# Patient Record
Sex: Male | Born: 1975 | Race: White | Hispanic: No | Marital: Married | Smoking: Never smoker
Health system: Southern US, Community
[De-identification: ages and names within clinical notes are randomized; demographics above are authoritative.]

## PROBLEM LIST (undated history)

## (undated) DIAGNOSIS — N4340 Spermatocele of epididymis, unspecified: Secondary | ICD-10-CM

## (undated) DIAGNOSIS — L409 Psoriasis, unspecified: Secondary | ICD-10-CM

## (undated) DIAGNOSIS — M7670 Peroneal tendinitis, unspecified leg: Secondary | ICD-10-CM

## (undated) DIAGNOSIS — M25372 Other instability, left ankle: Secondary | ICD-10-CM

## (undated) DIAGNOSIS — M1990 Unspecified osteoarthritis, unspecified site: Secondary | ICD-10-CM

## (undated) DIAGNOSIS — L309 Dermatitis, unspecified: Secondary | ICD-10-CM

## (undated) HISTORY — PX: SKIN GRAFT: SHX250

## (undated) HISTORY — PX: SHOULDER ARTHROSCOPY: SHX128

## (undated) HISTORY — PX: MYRINGOTOMY: SUR874

---

## 2006-11-24 ENCOUNTER — Ambulatory Visit: Payer: Self-pay | Admitting: Internal Medicine

## 2006-11-24 LAB — CONVERTED CEMR LAB
ALT: 29 units/L (ref 0–53)
AST: 42 units/L — ABNORMAL HIGH (ref 0–37)
Albumin: 4.5 g/dL (ref 3.5–5.2)
Alkaline Phosphatase: 38 units/L — ABNORMAL LOW (ref 39–117)
BUN: 11 mg/dL (ref 6–23)
Basophils Absolute: 0 10*3/uL (ref 0.0–0.1)
Basophils Relative: 0.5 % (ref 0.0–1.0)
Bilirubin, Direct: 0.2 mg/dL (ref 0.0–0.3)
CO2: 32 meq/L (ref 19–32)
Calcium: 9.4 mg/dL (ref 8.4–10.5)
Chloride: 106 meq/L (ref 96–112)
Cholesterol: 195 mg/dL (ref 0–200)
Creatinine, Ser: 1.1 mg/dL (ref 0.4–1.5)
Eosinophils Absolute: 0.1 10*3/uL (ref 0.0–0.6)
Eosinophils Relative: 1.7 % (ref 0.0–5.0)
GFR calc Af Amer: 103 mL/min
GFR calc non Af Amer: 85 mL/min
Glucose, Bld: 92 mg/dL (ref 70–99)
HCT: 40.7 % (ref 39.0–52.0)
HDL: 54.6 mg/dL (ref >39.0)
Hemoglobin: 14 g/dL (ref 13.0–17.0)
LDL Cholesterol: 130 mg/dL — ABNORMAL HIGH (ref 0–99)
Lymphocytes Relative: 34.7 % (ref 12.0–46.0)
MCHC: 34.4 g/dL (ref 30.0–36.0)
MCV: 91.4 fL (ref 78.0–100.0)
Monocytes Absolute: 0.3 10*3/uL (ref 0.2–0.7)
Monocytes Relative: 6.2 % (ref 3.0–11.0)
Neutro Abs: 2.5 10*3/uL (ref 1.4–7.7)
Neutrophils Relative %: 56.9 % (ref 43.0–77.0)
Platelets: 225 10*3/uL (ref 150–400)
Potassium: 3.9 meq/L (ref 3.5–5.1)
RBC: 4.45 M/uL (ref 4.22–5.81)
RDW: 12 % (ref 11.5–14.6)
Sodium: 142 meq/L (ref 135–145)
TSH: 1.57 microintl units/mL (ref 0.35–5.50)
Total Bilirubin: 1.3 mg/dL — ABNORMAL HIGH (ref 0.3–1.2)
Total CHOL/HDL Ratio: 3.6
Total Protein: 6.9 g/dL (ref 6.0–8.3)
Triglycerides: 52 mg/dL (ref 0–149)
VLDL: 10 mg/dL (ref 0–40)
WBC: 4.4 10*3/uL — ABNORMAL LOW (ref 4.5–10.5)

## 2006-11-27 ENCOUNTER — Telehealth: Payer: Self-pay | Admitting: Internal Medicine

## 2006-12-08 ENCOUNTER — Ambulatory Visit: Payer: Self-pay | Admitting: Internal Medicine

## 2007-03-20 ENCOUNTER — Ambulatory Visit: Payer: Self-pay | Admitting: Internal Medicine

## 2007-03-27 ENCOUNTER — Telehealth: Payer: Self-pay | Admitting: Internal Medicine

## 2007-11-27 ENCOUNTER — Ambulatory Visit: Payer: Self-pay | Admitting: Internal Medicine

## 2007-11-30 ENCOUNTER — Telehealth: Payer: Self-pay | Admitting: Internal Medicine

## 2007-12-03 ENCOUNTER — Ambulatory Visit: Payer: Self-pay | Admitting: Internal Medicine

## 2007-12-03 LAB — CONVERTED CEMR LAB
Bilirubin Urine: NEGATIVE
Blood in Urine, dipstick: NEGATIVE
Specific Gravity, Urine: 1.02
Urobilinogen, UA: 0.2

## 2007-12-06 ENCOUNTER — Encounter: Payer: Self-pay | Admitting: Internal Medicine

## 2008-03-21 ENCOUNTER — Ambulatory Visit: Payer: Self-pay | Admitting: Internal Medicine

## 2008-03-21 DIAGNOSIS — R109 Unspecified abdominal pain: Secondary | ICD-10-CM | POA: Insufficient documentation

## 2009-06-03 ENCOUNTER — Telehealth: Payer: Self-pay | Admitting: Internal Medicine

## 2010-02-22 ENCOUNTER — Ambulatory Visit
Admission: RE | Admit: 2010-02-22 | Discharge: 2010-02-22 | Payer: Self-pay | Source: Home / Self Care | Attending: Internal Medicine | Admitting: Internal Medicine

## 2010-02-22 DIAGNOSIS — R079 Chest pain, unspecified: Secondary | ICD-10-CM | POA: Insufficient documentation

## 2010-03-09 NOTE — Progress Notes (Signed)
Summary: Pt req antibiotic. Pls call in to [facility] on [address]  Phone Note Call from Patient Call back at [phone] cell   Caller: Patient Summary of Call: Pt called and says he has a swollen lft testicle and he would like to get an antibiotic called in for it. Please call in to [facility] on [address] and [address]  Initial call taken by: Lucy Antigua,  June 03, 2009 9:43 AM  Follow-up for Phone Call        cipro 500 two times a day for 10 days if persists or worsens he needs OV Follow-up by: Birdie Sons MD,  June 03, 2009 12:15 PM  Additional Follow-up for Phone Call Additional follow up Details #1::        Patient notified.   See Rx.  will call back as instructed. Additional Follow-up by: Gladis Riffle, RN,  June 03, 2009 1:57 PM    New/Updated Medications: CIPROFLOXACIN HCL 500 MG TABS (CIPROFLOXACIN HCL) Take 1 tablet by mouth two times a day x 10 days Prescriptions: CIPROFLOXACIN HCL 500 MG TABS (CIPROFLOXACIN HCL) Take 1 tablet by mouth two times a day x 10 days  #20 x 0   Entered by:   Gladis Riffle, RN   Authorized by:   Birdie Sons MD   Signed by:   Gladis Riffle, RN on 06/03/2009   Method used:   Electronically to        [facility] # [phone]* (retail)       [address]       [place], [place]  [address]       Ph: [phone]       Fax: [phone]   RxID:   [id number]
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
## 2010-03-14 ENCOUNTER — Inpatient Hospital Stay (INDEPENDENT_AMBULATORY_CARE_PROVIDER_SITE_OTHER)
Admission: RE | Admit: 2010-03-14 | Discharge: 2010-03-14 | Disposition: A | Discharge status: Home / Self Care | Payer: Managed Care, Other (non HMO) | Source: Ambulatory Visit | Attending: Family Medicine | Admitting: Family Medicine

## 2010-03-14 DIAGNOSIS — J310 Chronic rhinitis: Secondary | ICD-10-CM

## 2012-04-07 DIAGNOSIS — M7670 Peroneal tendinitis, unspecified leg: Secondary | ICD-10-CM

## 2012-04-07 DIAGNOSIS — M25372 Other instability, left ankle: Secondary | ICD-10-CM

## 2012-04-07 HISTORY — DX: Peroneal tendinitis, unspecified leg: M76.70

## 2012-04-07 HISTORY — DX: Other instability, left ankle: M25.372

## 2012-04-19 ENCOUNTER — Encounter (HOSPITAL_BASED_OUTPATIENT_CLINIC_OR_DEPARTMENT_OTHER): Payer: Self-pay | Admitting: *Deleted

## 2012-04-26 ENCOUNTER — Ambulatory Visit (HOSPITAL_BASED_OUTPATIENT_CLINIC_OR_DEPARTMENT_OTHER): Payer: Managed Care, Other (non HMO) | Admitting: Certified Registered Nurse Anesthetist

## 2012-04-26 ENCOUNTER — Encounter (HOSPITAL_BASED_OUTPATIENT_CLINIC_OR_DEPARTMENT_OTHER): Payer: Self-pay | Admitting: Certified Registered Nurse Anesthetist

## 2012-04-26 ENCOUNTER — Ambulatory Visit (HOSPITAL_BASED_OUTPATIENT_CLINIC_OR_DEPARTMENT_OTHER)
Admission: RE | Admit: 2012-04-26 | Discharge: 2012-04-26 | Disposition: A | Discharge status: Home / Self Care | Payer: Managed Care, Other (non HMO) | Source: Ambulatory Visit | Attending: Orthopedic Surgery | Admitting: Orthopedic Surgery

## 2012-04-26 ENCOUNTER — Encounter (HOSPITAL_BASED_OUTPATIENT_CLINIC_OR_DEPARTMENT_OTHER): Payer: Self-pay

## 2012-04-26 ENCOUNTER — Encounter (HOSPITAL_BASED_OUTPATIENT_CLINIC_OR_DEPARTMENT_OTHER)
Admission: RE | Disposition: A | Discharge status: Home / Self Care | Payer: Self-pay | Source: Ambulatory Visit | Attending: Orthopedic Surgery

## 2012-04-26 DIAGNOSIS — M24873 Other specific joint derangements of unspecified ankle, not elsewhere classified: Secondary | ICD-10-CM | POA: Insufficient documentation

## 2012-04-26 DIAGNOSIS — M25372 Other instability, left ankle: Secondary | ICD-10-CM

## 2012-04-26 DIAGNOSIS — M24173 Other articular cartilage disorders, unspecified ankle: Secondary | ICD-10-CM | POA: Insufficient documentation

## 2012-04-26 DIAGNOSIS — M24073 Loose body in unspecified ankle: Secondary | ICD-10-CM | POA: Insufficient documentation

## 2012-04-26 DIAGNOSIS — M659 Unspecified synovitis and tenosynovitis, unspecified site: Secondary | ICD-10-CM | POA: Insufficient documentation

## 2012-04-26 DIAGNOSIS — M2408 Loose body, other site: Secondary | ICD-10-CM | POA: Insufficient documentation

## 2012-04-26 DIAGNOSIS — M65979 Unspecified synovitis and tenosynovitis, unspecified ankle and foot: Secondary | ICD-10-CM | POA: Insufficient documentation

## 2012-04-26 DIAGNOSIS — M959 Acquired deformity of musculoskeletal system, unspecified: Secondary | ICD-10-CM | POA: Insufficient documentation

## 2012-04-26 DIAGNOSIS — M24176 Other articular cartilage disorders, unspecified foot: Secondary | ICD-10-CM | POA: Insufficient documentation

## 2012-04-26 HISTORY — DX: Unspecified osteoarthritis, unspecified site: M19.90

## 2012-04-26 HISTORY — PX: ANKLE ARTHROSCOPY WITH RECONSTRUCTION: SHX5583

## 2012-04-26 HISTORY — DX: Peroneal tendinitis, unspecified leg: M76.70

## 2012-04-26 HISTORY — DX: Other instability, left ankle: M25.372

## 2012-04-26 SURGERY — ARTHROSCOPY, ANKLE, WITH RECONSTRUCTION
Anesthesia: General | Site: Ankle | Laterality: Left | Wound class: Clean

## 2012-04-26 MED ORDER — OXYCODONE HCL 5 MG PO TABS: 5.0000 mg | ORAL_TABLET | ORAL | Status: DC | PRN

## 2012-04-26 MED ORDER — DEXAMETHASONE SODIUM PHOSPHATE 10 MG/ML IJ SOLN
INTRAMUSCULAR | Status: DC | PRN
  Administered 2012-04-26: 10 mg

## 2012-04-26 MED ORDER — OXYCODONE HCL 5 MG PO TABS
5.0000 mg | ORAL_TABLET | Freq: Once | ORAL | Status: AC | PRN
  Administered 2012-04-26: 5 mg via ORAL

## 2012-04-26 MED ORDER — SODIUM CHLORIDE 0.9 % IR SOLN
Status: DC | PRN
  Administered 2012-04-26: 3000 mL

## 2012-04-26 MED ORDER — FENTANYL CITRATE 0.05 MG/ML IJ SOLN
50.0000 ug | INTRAMUSCULAR | Status: DC | PRN
  Administered 2012-04-26: 100 ug via INTRAVENOUS

## 2012-04-26 MED ORDER — DEXAMETHASONE SODIUM PHOSPHATE 4 MG/ML IJ SOLN
INTRAMUSCULAR | Status: DC | PRN
  Administered 2012-04-26: 10 mg via INTRAVENOUS

## 2012-04-26 MED ORDER — PROPOFOL 10 MG/ML IV BOLUS
INTRAVENOUS | Status: DC | PRN
  Administered 2012-04-26: 240 mg via INTRAVENOUS

## 2012-04-26 MED ORDER — HYDROMORPHONE HCL PF 1 MG/ML IJ SOLN
0.2500 mg | INTRAMUSCULAR | Status: DC | PRN
  Administered 2012-04-26 (×2): 0.5 mg via INTRAVENOUS

## 2012-04-26 MED ORDER — OXYCODONE HCL 5 MG/5ML PO SOLN: 5.0000 mg | Freq: Once | ORAL | Status: AC | PRN

## 2012-04-26 MED ORDER — ONDANSETRON HCL 4 MG/2ML IJ SOLN
INTRAMUSCULAR | Status: DC | PRN
  Administered 2012-04-26: 4 mg via INTRAVENOUS

## 2012-04-26 MED ORDER — MIDAZOLAM HCL 5 MG/5ML IJ SOLN
INTRAMUSCULAR | Status: DC | PRN
  Administered 2012-04-26: 1 mg via INTRAVENOUS

## 2012-04-26 MED ORDER — ONDANSETRON HCL 4 MG/2ML IJ SOLN: 4.0000 mg | Freq: Once | INTRAMUSCULAR | Status: DC | PRN

## 2012-04-26 MED ORDER — LIDOCAINE HCL (CARDIAC) 20 MG/ML IV SOLN
INTRAVENOUS | Status: DC | PRN
  Administered 2012-04-26: 30 mg via INTRAVENOUS

## 2012-04-26 MED ORDER — EPHEDRINE SULFATE 50 MG/ML IJ SOLN
INTRAMUSCULAR | Status: DC | PRN
  Administered 2012-04-26: 10 mg via INTRAVENOUS

## 2012-04-26 MED ORDER — BUPIVACAINE-EPINEPHRINE PF 0.5-1:200000 % IJ SOLN
INTRAMUSCULAR | Status: DC | PRN
  Administered 2012-04-26: 35 mL

## 2012-04-26 MED ORDER — MIDAZOLAM HCL 2 MG/ML PO SYRP: 12.0000 mg | ORAL_SOLUTION | Freq: Once | ORAL | Status: DC | PRN

## 2012-04-26 MED ORDER — LACTATED RINGERS IV SOLN
INTRAVENOUS | Status: DC
  Administered 2012-04-26 (×2): via INTRAVENOUS

## 2012-04-26 MED ORDER — MIDAZOLAM HCL 2 MG/2ML IJ SOLN
1.0000 mg | INTRAMUSCULAR | Status: DC | PRN
  Administered 2012-04-26: 2 mg via INTRAVENOUS

## 2012-04-26 MED ORDER — BACITRACIN ZINC 500 UNIT/GM EX OINT
TOPICAL_OINTMENT | CUTANEOUS | Status: DC | PRN
  Administered 2012-04-26: 1 via TOPICAL

## 2012-04-26 SURGICAL SUPPLY — 73 items
ANCHOR CORKSCREW 3.5X10 FT (Anchor) ×4 IMPLANT
BANDAGE ESMARK 6X9 LF (GAUZE/BANDAGES/DRESSINGS) ×1 IMPLANT
BLADE CUDA 2.0 (BLADE) IMPLANT
BLADE CUDA GRT WHITE 3.5 (BLADE) ×2 IMPLANT
BLADE CUDA SHAVER 3.5 (BLADE) IMPLANT
BLADE SURG 15 STRL LF DISP TIS (BLADE) ×1 IMPLANT
BLADE SURG 15 STRL SS (BLADE) ×1
BNDG COHESIVE 4X5 TAN STRL (GAUZE/BANDAGES/DRESSINGS) ×2 IMPLANT
BNDG COHESIVE 6X5 TAN STRL LF (GAUZE/BANDAGES/DRESSINGS) ×2 IMPLANT
BNDG ESMARK 6X9 LF (GAUZE/BANDAGES/DRESSINGS) ×2
BUR 3.5 LG SPHERICAL (BURR) IMPLANT
BUR CUDA 2.9 (BURR) ×2 IMPLANT
BUR FULL RADIUS 2.9 (BURR) IMPLANT
BUR GATOR 2.9 (BURR) IMPLANT
BUR OVAL 4.0 (BURR) IMPLANT
BUR SPHERICAL 2.9 (BURR) IMPLANT
BUR VERTEX HOODED 4.5 (BURR) IMPLANT
BURR 3.5 LG SPHERICAL (BURR)
CANISTER OMNI JUG 16 LITER (MISCELLANEOUS) ×2 IMPLANT
CANISTER SUCTION 2500CC (MISCELLANEOUS) ×2 IMPLANT
CHLORAPREP W/TINT 26ML (MISCELLANEOUS) ×2 IMPLANT
CLOTH BEACON ORANGE TIMEOUT ST (SAFETY) ×2 IMPLANT
CUFF TOURNIQUET SINGLE 34IN LL (TOURNIQUET CUFF) ×2 IMPLANT
DRAPE EXTREMITY T 121X128X90 (DRAPE) ×2 IMPLANT
DRAPE OEC MINIVIEW 54X84 (DRAPES) IMPLANT
DRAPE U-SHAPE 47X51 STRL (DRAPES) ×2 IMPLANT
DRSG EMULSION OIL 3X3 NADH (GAUZE/BANDAGES/DRESSINGS) ×2 IMPLANT
DURA STEPPER LG (CAST SUPPLIES) IMPLANT
DURA STEPPER MED (CAST SUPPLIES) IMPLANT
DURA STEPPER SML (CAST SUPPLIES) IMPLANT
ELECT REM PT RETURN 9FT ADLT (ELECTROSURGICAL) ×2
ELECTRODE REM PT RTRN 9FT ADLT (ELECTROSURGICAL) ×1 IMPLANT
GLOVE BIO SURGEON STRL SZ8 (GLOVE) ×4 IMPLANT
GLOVE BIOGEL PI IND STRL 8 (GLOVE) ×1 IMPLANT
GLOVE BIOGEL PI INDICATOR 8 (GLOVE) ×1
GLOVE ECLIPSE 6.5 STRL STRAW (GLOVE) ×2 IMPLANT
GLOVE EXAM NITRILE MD LF STRL (GLOVE) IMPLANT
GLOVE INDICATOR 6.5 STRL GRN (GLOVE) ×2 IMPLANT
GOWN PREVENTION PLUS XLARGE (GOWN DISPOSABLE) ×2 IMPLANT
GOWN PREVENTION PLUS XXLARGE (GOWN DISPOSABLE) ×2 IMPLANT
NS IRRIG 1000ML POUR BTL (IV SOLUTION) IMPLANT
PACK ARTHROSCOPY DSU (CUSTOM PROCEDURE TRAY) ×2 IMPLANT
PACK BASIN DAY SURGERY FS (CUSTOM PROCEDURE TRAY) ×2 IMPLANT
PAD CAST 4YDX4 CTTN HI CHSV (CAST SUPPLIES) ×1 IMPLANT
PADDING CAST ABS 4INX4YD NS (CAST SUPPLIES) ×1
PADDING CAST ABS COTTON 4X4 ST (CAST SUPPLIES) ×1 IMPLANT
PADDING CAST COTTON 4X4 STRL (CAST SUPPLIES) ×1
PADDING CAST COTTON 6X4 STRL (CAST SUPPLIES) ×2 IMPLANT
PENCIL BUTTON HOLSTER BLD 10FT (ELECTRODE) ×2 IMPLANT
SANITIZER HAND PURELL 535ML FO (MISCELLANEOUS) ×2 IMPLANT
SET IRRIG Y TYPE TUR BLADDER L (SET/KITS/TRAYS/PACK) ×2 IMPLANT
SLEEVE SCD COMPRESS KNEE MED (MISCELLANEOUS) ×2 IMPLANT
SPLINT FAST PLASTER 5X30 (CAST SUPPLIES) ×20
SPLINT PLASTER CAST FAST 5X30 (CAST SUPPLIES) ×20 IMPLANT
SPONGE GAUZE 4X4 12PLY (GAUZE/BANDAGES/DRESSINGS) ×2 IMPLANT
SPONGE LAP 18X18 X RAY DECT (DISPOSABLE) ×2 IMPLANT
STOCKINETTE 6  STRL (DRAPES) ×1
STOCKINETTE 6 STRL (DRAPES) ×1 IMPLANT
STRAP ANKLE FOOT DISTRACTOR (ORTHOPEDIC SUPPLIES) ×2 IMPLANT
SUCTION FRAZIER TIP 10 FR DISP (SUCTIONS) IMPLANT
SUT ETHILON 3 0 PS 1 (SUTURE) ×2 IMPLANT
SUT MNCRL AB 3-0 PS2 18 (SUTURE) ×2 IMPLANT
SUT VIC AB 0 CT1 27 (SUTURE) ×1
SUT VIC AB 0 CT1 27XBRD ANBCTR (SUTURE) ×1 IMPLANT
SUT VIC AB 2-0 SH 18 (SUTURE) IMPLANT
SUT VIC AB 2-0 SH 27 (SUTURE)
SUT VIC AB 2-0 SH 27XBRD (SUTURE) IMPLANT
SUT VIC AB 3-0 PS1 18 (SUTURE)
SUT VIC AB 3-0 PS1 18XBRD (SUTURE) IMPLANT
SYR BULB 3OZ (MISCELLANEOUS) ×2 IMPLANT
TOWEL OR 17X24 6PK STRL BLUE (TOWEL DISPOSABLE) ×6 IMPLANT
TUBE CONNECTING 20X1/4 (TUBING) IMPLANT
WATER STERILE IRR 1000ML POUR (IV SOLUTION) ×2 IMPLANT

## 2012-04-26 NOTE — Anesthesia Procedure Notes (Addendum)
Anesthesia Regional Block:  Popliteal block  Pre-Anesthetic Checklist: ,, timeout performed, Correct Patient, Correct Site, Correct Laterality, Correct Procedure, Correct Position, site marked, Risks and benefits discussed,  Surgical consent,  Pre-op evaluation,  At surgeon's request and post-op pain management  Laterality: Left and Lower  Prep: chloraprep       Needles:  Injection technique: Single-shot  Needle Type: Echogenic Needle     Needle Length: 9cm  Needle Gauge: 21    Additional Needles:  Procedures: ultrasound guided (picture in chart) Popliteal block Narrative:  Start time: 04/26/2012 7:10 AM End time: 04/26/2012 7:18 AM Injection made incrementally with aspirations every 5 mL.  Performed by: Personally  Anesthesiologist: Sheldon Silvan, MD  Popliteal block Procedure Name: LMA Insertion Date/Time: 04/26/2012 7:39 AM Performed by: Jetaun Colbath D Pre-anesthesia Checklist: Patient identified, Emergency Drugs available, Suction available and Patient being monitored Patient Re-evaluated:Patient Re-evaluated prior to inductionOxygen Delivery Method: Circle System Utilized Preoxygenation: Pre-oxygenation with 100% oxygen Intubation Type: IV induction Ventilation: Mask ventilation without difficulty LMA: LMA inserted LMA Size: 5.0 Number of attempts: 1 Placement Confirmation: positive ETCO2 Tube secured with: Tape Dental Injury: Teeth and Oropharynx as per pre-operative assessment
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
## 2012-04-26 NOTE — Transfer of Care (Signed)
Immediate Anesthesia Transfer of Care Note  Patient: Matthew Burch  Procedure(s) Performed: Procedure(s): LEFT ANKLE ARTHROSCOPY WITH EXTENSIVE DEBRIDEMENT, REMOVAL OF LOOSE BODY AND MICROFRACTURE; LEFT ANKLE OPEN LATERAL LIGAMENT RECONSTRUCTION; LEFT PERONEUS-LONGUS  TENOLYSIS;  AND  PERONEUS-BREVIS REPAIR  (Left)  Patient Location: PACU  Anesthesia Type:GA combined with regional for post-op pain  Level of Consciousness: awake and patient cooperative  Airway & Oxygen Therapy: Patient Spontanous Breathing and Patient connected to face mask oxygen  Post-op Assessment: Report given to PACU RN and Post -op Vital signs reviewed and stable  Post vital signs: Reviewed and stable  Complications: No apparent anesthesia complications

## 2012-04-26 NOTE — Anesthesia Preprocedure Evaluation (Signed)

## 2012-04-26 NOTE — Progress Notes (Signed)
Assisted Dr. Crews with left, ultrasound guided, popliteal/saphenous block. Side rails up, monitors on throughout procedure. See vital signs in flow sheet. Tolerated Procedure well. 

## 2012-04-26 NOTE — Brief Op Note (Signed)
04/26/2012  9:44 AM  PATIENT:  Matthew Burch  37 y.o. male  PRE-OPERATIVE DIAGNOSIS:  Left Ankle Instability; Peroneal Tendonitis  POST-OPERATIVE DIAGNOSIS:  1.  Left ankle chronic instability      2.  Left lateral talar dome osteochondral defect      3.  Synovitis of medial, lateral and anterior gutters      4.  Loose body in the medial gutter      5.  Left peroneus brevis tendon tear      6.  Left peroneus longus tenosynovitis  Procedure(s): 1.  Left ankle arthroscopy with extensive debridement 2.  Left ankle arthroscopic removal of loose body 3.  Left ankle arthroscopic microfracture of lateral talar dome osteochondral defect 4.  Left ankle peroneus longus tenolysis 5.  Left ankle peroneus brevis tendon repair 6.  Left ankle lateral ligament reconstruction ([place])  SURGEON:  Toni Arthurs, MD  ASSISTANT: n/a  ANESTHESIA:   General, regional  EBL:  minimal   TOURNIQUET:   Total Tourniquet Time Documented: Thigh (Left) - 94 minutes Total: Thigh (Left) - 94 minutes   COMPLICATIONS:  None apparent  DISPOSITION:  Extubated, awake and stable to recovery.  DICTATION ID:   [id number]

## 2012-04-26 NOTE — Anesthesia Postprocedure Evaluation (Signed)
  Anesthesia Post-op Note  Patient: Matthew Burch  Procedure(s) Performed: Procedure(s): LEFT ANKLE ARTHROSCOPY WITH EXTENSIVE DEBRIDEMENT, REMOVAL OF LOOSE BODY AND MICROFRACTURE; LEFT ANKLE OPEN LATERAL LIGAMENT RECONSTRUCTION; LEFT PERONEUS-LONGUS  TENOLYSIS;  AND  PERONEUS-BREVIS REPAIR  (Left)  Patient Location: PACU  Anesthesia Type:GA combined with regional for post-op pain  Level of Consciousness: awake, alert  and oriented  Airway and Oxygen Therapy: Patient Spontanous Breathing and Patient connected to face mask oxygen  Post-op Pain: none  Post-op Assessment: Post-op Vital signs reviewed  Post-op Vital Signs: Reviewed  Complications: No apparent anesthesia complications

## 2012-04-27 NOTE — Op Note (Signed)
NAMEMRK, BUZBY             ACCOUNT NO.:  [id number]  MEDICAL RECORD NO.:  [id number]  LOCATION:                                 FACILITY:  PHYSICIAN:  Toni Arthurs, MD             DATE OF BIRTH:  DATE OF PROCEDURE:  04/26/2012 DATE OF DISCHARGE:                              OPERATIVE REPORT   PREOPERATIVE DIAGNOSIS:  Chronic left ankle instability, left ankle synovitis, and peroneal tendonitis with possible tear.  POSTOPERATIVE DIAGNOSES: 1. Left ankle chronic instability. 2. Left lateral talar dome osteochondral defect. 3. Synovitis of the medial, lateral, and anterior gutters of the left     ankle. 4. Loose body in the left ankle medial gutter. 5. Left peroneus brevis tendon tear. 6. Left peroneus longus tenosynovitis.  PROCEDURES: 1. Left ankle arthroscopy with extensive debridement including the     medial, lateral, anterior, and posterior gutters. 2. Left ankle arthroscopy with removal of loose body. 3. Left ankle microfracture of the lateral talar dome osteochondral     lesion. 4. Left ankle peroneus longus tenolysis. 5. Left ankle peroneus brevis tendon repair. 6. Left ankle lateral ligament reconstruction Toniann Ket)  SURGEON:  Toni Arthurs, MD  ANESTHESIA:  General, regional.  ESTIMATED BLOOD LOSS:  Minimal.  TOURNIQUET TIME:  94 minutes at 225 mmHg.  COMPLICATIONS:  None apparent.  DISPOSITION:  Extubated, awake, and stable to the recovery room.  INDICATIONS FOR PROCEDURE:  The patient is a 37 year old male who has a history of multiple left ankle injuries, playing sports over the years. He has improvement in his pain and instability symptoms when using a brace.  He has failed treatment with activity modification, oral anti- inflammatories, extensive physical therapy, and bracing.  He desires surgical correction of his unstable lateral ankle ligaments.  He also has signs and symptoms consistent with peroneal tenosynovitis and a possible peroneal  tendon tear as well as ankle synovitis.  He presents now for left ankle arthroscopy and lateral ligament reconstruction, possible repair or tenolysis of his peroneal tendons.  He understands the risks, the benefits, the alternative treatment options, and elects surgical treatment.  He specifically understands the risks of bleeding, infection, nerve damage, blood clots, need for additional surgery, continued pain, amputation, and death.  PROCEDURE IN DETAIL:  After preoperative consent was obtained and the correct operative site was identified, the patient was brought to the operating room and placed supine on the operating table.  General anesthesia was induced.  Preoperative antibiotics were administered. Surgical time-out was taken.  The left foot was then examined.  The patient has a grade 2 anterior drawer in neutral and in plantar flexion. He has a grade 0 anterior drawer in external rotation.  The left lower extremity was then prepped and draped in standard sterile fashion with a thigh tourniquet and a thigh holder in place.  The foot was exsanguinated and the tourniquet was inflated to 225 mmHg.  A traction device was placed on the foot.  Anteromedial arthroscopy portal was established and the arthroscope was inserted into the ankle joint. Immediately evident was significant synovitis in the anterior gutter. An anterolateral arthroscopy portal was established using the  nick and spread technique.  The shaver was inserted and the anterior synovitis was debrided.  The lateral gutter was noted to have significant synovitis as well as a Bassett lesion.  This was debrided along with synovitis laterally as well.  The syndesmosis was noted to be stable, but had some synovitis and this was again debrided.  The lateral gutter was noted to have no loose bodies.  The posterior compartment was examined and again had no loose body, but some synovitis which was debrided with the shaver.  The  medial compartment was examined.  There was some grade 1 chondromalacia over the medial talar dome.  The central talar dome had healthy and intact cartilage as did the tibial plafond. The lateral talar dome had an osteochondral lesion that extended from the posterior 2/3rd to about the anterior 1/4.  Anteriorly, there was bone exposed over an area of about 1-cm long and 5-mm wide.  This was debrided of all loose cartilage with a shaver and a microfracture awl was used to punch a hole through the subchondral bone in the central of this area.  The remaining area of the osteochondral lesion had fiber cartilage that was intact with no exposed bone.  The anterior portion of the talus had healthy and intact cartilage.  The medial gutter was examined and a loose body was noted in the recess distal to the medial malleolus.  This was grasped with a grasper after switching the camera to the lateral portal.  The loose body was removed in its entirety. Synovitis in the medial gutter was debrided with the shaver as well.  At this point, all arthroscopic instruments were removed.  A thigh holder was removed.  The left lower extremity was then positioned flat.  A curvilinear incision was made over the lateral malleolus.  Sharp dissection was carried down through the skin and subcutaneous tissue. The ATFL and CFL origins were released from the anterior aspect of the fibula and the periosteum was elevated proximally for about a cm.  The peroneal tendon sheath was incised distally.  The peroneus brevis was noted to have a significant tear.  The peroneus longus was noted to have significant tenosynovitis.  The retinaculum was then incised as was the tendon sheath proximally to get a better look at the tendons.  All the peroneus longus tenosynovitis was excised sharply with scissors and the knife.  The peroneus brevis had low lying muscle belly which was debrided sharply.  The tear was then trimmed of all  fibrinous material. The tear was repaired with horizontal mattress sutures of 0 Vicryl with the knots buried within the tear.  The leading edge of the tear was then repaired with running 0 Vicryl.  At this point, the anterior portion of the fibula was debrided of cortical bone.  Two Arthrex double-loaded 3.5- mm Titanium anchors were inserted, one at the origin of the CFL and one at the origin of the ATFL.  The sutures were then passed into the CFL and ATFL in horizontal mattress fashion.  The ligaments were advanced up onto the bleeding healthy bone of the distal fibula and tied.  The suture was then passed underneath the periosteum and the periosteum was advanced over the tendons.  These were again tied and the sutures were then passed down into the retinaculum, pulling the retinaculum up on to the fibula and over the periosteum and ligament origins.  These sutures were again tied.  The superior peroneal retinaculum was then repaired with imbricating  sutures of the 0 FiberWire placed in subperiosteal fashion.  The peroneal tendon sheath was repaired with simple sutures of 0 FiberWire.  Subcutaneous tissue was approximated with inverted simple sutures of 3-0 Monocryl and a running 3-0 nylon was used to close the skin incision.  The 2 anterior arthroscopy portals were closed with horizontal mattress sutures of 3-0 nylon.  Sterile dressings were applied followed by a well-padded short-leg splint with the hindfoot position in slight eversion.  The tourniquet was released at 94 minutes after application of the dressings.  Prior to applying the dressings, an anterior drawer test was repeated in neutral and in plantar flexion, and it was noted to be grade 0 in both positions.  The patient was then awakened from anesthesia and transported to the recovery room in stable condition.  FOLLOWUP PLAN:  The patient will be nonweightbearing on the left lower extremity.  He will follow up with me in 2  weeks for suture removal and conversion to a Cam walker boot, so that he can begin range of motion.     Toni Arthurs, MD     JH/MEDQ  D:  04/26/2012  T:  04/27/2012  Job:  [id number]

## 2012-04-30 ENCOUNTER — Encounter (HOSPITAL_BASED_OUTPATIENT_CLINIC_OR_DEPARTMENT_OTHER): Payer: Self-pay | Admitting: Orthopedic Surgery

## 2012-09-02 ENCOUNTER — Emergency Department (HOSPITAL_COMMUNITY)
Admission: EM | Admit: 2012-09-02 | Discharge: 2012-09-02 | Disposition: A | Discharge status: Home / Self Care | Payer: Managed Care, Other (non HMO) | Attending: Emergency Medicine | Admitting: Emergency Medicine

## 2012-09-02 ENCOUNTER — Encounter (HOSPITAL_COMMUNITY): Payer: Self-pay | Admitting: Emergency Medicine

## 2012-09-02 ENCOUNTER — Emergency Department (HOSPITAL_COMMUNITY): Payer: Managed Care, Other (non HMO)

## 2012-09-02 DIAGNOSIS — Y9389 Activity, other specified: Secondary | ICD-10-CM | POA: Insufficient documentation

## 2012-09-02 DIAGNOSIS — Z79899 Other long term (current) drug therapy: Secondary | ICD-10-CM | POA: Insufficient documentation

## 2012-09-02 DIAGNOSIS — S61309A Unspecified open wound of unspecified finger with damage to nail, initial encounter: Secondary | ICD-10-CM

## 2012-09-02 DIAGNOSIS — Y9289 Other specified places as the place of occurrence of the external cause: Secondary | ICD-10-CM | POA: Insufficient documentation

## 2012-09-02 DIAGNOSIS — Z23 Encounter for immunization: Secondary | ICD-10-CM | POA: Insufficient documentation

## 2012-09-02 DIAGNOSIS — W230XXA Caught, crushed, jammed, or pinched between moving objects, initial encounter: Secondary | ICD-10-CM | POA: Insufficient documentation

## 2012-09-02 DIAGNOSIS — S61209A Unspecified open wound of unspecified finger without damage to nail, initial encounter: Secondary | ICD-10-CM | POA: Insufficient documentation

## 2012-09-02 DIAGNOSIS — Z8739 Personal history of other diseases of the musculoskeletal system and connective tissue: Secondary | ICD-10-CM | POA: Insufficient documentation

## 2012-09-02 MED ORDER — CEPHALEXIN 250 MG PO CAPS
500.0000 mg | ORAL_CAPSULE | Freq: Once | ORAL | Status: AC
  Administered 2012-09-02: 500 mg via ORAL
  Filled 2012-09-02: qty 2

## 2012-09-02 MED ORDER — ONDANSETRON 4 MG PO TBDP
4.0000 mg | ORAL_TABLET | Freq: Once | ORAL | Status: AC
  Administered 2012-09-02: 4 mg via ORAL
  Filled 2012-09-02: qty 1

## 2012-09-02 MED ORDER — OXYCODONE-ACETAMINOPHEN 5-325 MG PO TABS
2.0000 | ORAL_TABLET | Freq: Once | ORAL | Status: AC
  Administered 2012-09-02: 2 via ORAL
  Filled 2012-09-02: qty 2

## 2012-09-02 MED ORDER — HYDROCODONE-ACETAMINOPHEN 5-325 MG PO TABS: 2.0000 | ORAL_TABLET | ORAL | Status: AC | PRN

## 2012-09-02 MED ORDER — TETANUS-DIPHTH-ACELL PERTUSSIS 5-2.5-18.5 LF-MCG/0.5 IM SUSP
0.5000 mL | Freq: Once | INTRAMUSCULAR | Status: AC
  Administered 2012-09-02: 0.5 mL via INTRAMUSCULAR
  Filled 2012-09-02 (×2): qty 0.5

## 2012-09-02 MED ORDER — CEPHALEXIN 500 MG PO CAPS: 500.0000 mg | ORAL_CAPSULE | Freq: Four times a day (QID) | ORAL | Status: AC

## 2012-09-02 NOTE — Progress Notes (Signed)
Orthopedic Tech Progress Note Patient Details:  Matthew Burch [birth year] [id number]  Ortho Devices Type of Ortho Device: Finger splint Ortho Device/Splint Interventions: Application   Cammer, Mickie Bail 09/02/2012, 5:07 PM

## 2012-09-02 NOTE — ED Notes (Signed)
Patient transported to X-ray 

## 2012-09-02 NOTE — ED Provider Notes (Signed)
Medical screening examination/treatment/procedure(s) were performed by non-physician practitioner and as supervising physician I was immediately available for consultation/collaboration.   Lyanne Co, MD 09/02/12 [phone]
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
## 2012-09-02 NOTE — ED Provider Notes (Signed)
CSN: [id number]     Arrival date & time 09/02/12  1241 History     First MD Initiated Contact with Patient 09/02/12 1302     Chief Complaint  Patient presents with  . Laceration   (Consider location/radiation/quality/duration/timing/severity/associated sxs/prior Treatment) HPI  Matthew Burch is a 36 y.o.male without any significant PMH presents to the ER with complaints of left ring finger injury. He was working on his mountain bike when his finger got caught in a component of the bike causing the nail to partially avulse. The pain is 7/10, it is no longer bleeding, he denies numbness, tingling or being unable to move the finger. Last tetanus shot is unknown.    Past Medical History  Diagnosis Date  . Arthritis     left shoulder  . 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 Peroneal tendonitis 04/2012    left   Past Surgical History  Procedure Laterality Date  . Shoulder arthroscopy Left   . Skin graft  age 81  . Ankle arthroscopy with reconstruction Left 04/26/2012    Procedure: LEFT ANKLE ARTHROSCOPY WITH EXTENSIVE DEBRIDEMENT, REMOVAL OF LOOSE BODY AND MICROFRACTURE; LEFT ANKLE OPEN LATERAL LIGAMENT RECONSTRUCTION; LEFT PERONEUS-LONGUS  TENOLYSIS;  AND  PERONEUS-BREVIS REPAIR ;  Surgeon: Toni Arthurs, MD;  Location: [facility];  Service: Orthopedics;  Laterality: Left;   No family history on file. History  Substance Use Topics  . Smoking status: Never Smoker   . Smokeless tobacco: Never Used  . Alcohol Use: Yes     Comment: 1 drink/day    Review of Systems  Skin: Positive for wound.  All other systems reviewed and are negative.    Allergies  Iodinated diagnostic agents  Home Medications   Current Outpatient Rx  Name  Route  Sig  Dispense  Refill  . cephALEXin (KEFLEX) 500 MG capsule   Oral   Take 1 capsule (500 mg total) by mouth 4 (four) times daily.   28 capsule   0   . HYDROcodone-acetaminophen (NORCO/VICODIN) 5-325 MG per tablet   Oral   Take 2  tablets by mouth every 4 (four) hours as needed for pain.   20 tablet   0    BP 129/86  Pulse 55  Temp(Src) 98 F (36.7 C) (Oral)  Resp 16  Ht 5\' 10"  (1.778 m)  Wt 170 lb (77.111 kg)  BMI 24.39 kg/m2  SpO2 99% Physical Exam  Nursing note and vitals reviewed. Constitutional: He appears well-developed and well-nourished. No distress.  HENT:  Head: Normocephalic and atraumatic.  Eyes: Pupils are equal, round, and reactive to light.  Neck: Normal range of motion. Neck supple.  Cardiovascular: Normal rate and regular rhythm.   Pulmonary/Chest: Effort normal.  Abdominal: Soft.  Musculoskeletal:       Left hand: He exhibits tenderness, bony tenderness, laceration and swelling. He exhibits normal range of motion and normal capillary refill. Normal sensation noted. Normal strength noted.  pts fingernail has been pulled off of bed. No obvious laceration. Not actively bleeding.  Neurological: He is alert.  Skin: Skin is warm and dry.    ED Course   NERVE BLOCK Date/Time: 09/02/2012 2:35 PM Performed by: Dorthula Matas Authorized by: Dorthula Matas Consent: Verbal consent obtained. Indications: pain relief Body area: upper extremity Nerve: digital Laterality: left Anesthetic total: 5 ml   (including critical care time)  Labs Reviewed - No data to display Dg Finger Ring Left  09/02/2012   *RADIOLOGY REPORT*  Clinical Data:  Laceration  LEFT RING FINGER 2+V  Comparison: None.  Findings: Soft tissue injury is seen in the region of the DIP joint.  No evidence of fracture or radiopaque foreign object.  No visible gas in the joint.  IMPRESSION: Soft tissue injury.  No bony finding.  No radiopaque foreign object.   Original Report Authenticated By: Paulina Fusi, M.D.   1. Fingernail avulsion, partial, initial encounter     MDM   Nail tucked back into nailbed fold. wrapped in non occlusive dressing and splinted. He will follow-up with Hand surgeon.  Tetanus and Keflex given in  ED. No fracture.  Rx: Vicodin and Keflex.  37 y.o.Matthew Burch's evaluation in the Emergency Department is complete. It has been determined that no acute conditions requiring further emergency intervention are present at this time. The patient/guardian have been advised of the diagnosis and plan. We have discussed signs and symptoms that warrant return to the ED, such as changes or worsening in symptoms.  Vital signs are stable at discharge. Filed Vitals:   09/02/12 1308  BP: 129/86  Pulse: 55  Temp: 98 F (36.7 C)  Resp: 16    Patient/guardian has voiced understanding and agreed to follow-up with the PCP or specialist.    Dorthula Matas, PA-C 09/02/12 1502

## 2012-11-12 ENCOUNTER — Ambulatory Visit (INDEPENDENT_AMBULATORY_CARE_PROVIDER_SITE_OTHER): Payer: Managed Care, Other (non HMO) | Admitting: Family

## 2012-11-12 ENCOUNTER — Encounter: Payer: Self-pay | Admitting: Family

## 2012-11-12 VITALS — BP 120/60 | HR 69 | Temp 98.0°F | Wt 173.0 lb

## 2012-11-12 DIAGNOSIS — R509 Fever, unspecified: Secondary | ICD-10-CM

## 2012-11-12 LAB — CBC WITH DIFFERENTIAL/PLATELET
Basophils Relative: 0.8 % (ref 0.0–3.0)
Eosinophils Relative: 5.1 % — ABNORMAL HIGH (ref 0.0–5.0)
Lymphocytes Relative: 20.8 % (ref 12.0–46.0)
Neutrophils Relative %: 61 % (ref 43.0–77.0)
RBC: 4.65 Mil/uL (ref 4.22–5.81)
WBC: 5.7 10*3/uL (ref 4.5–10.5)

## 2012-11-12 MED ORDER — HYDROCOD POLST-CHLORPHEN POLST 10-8 MG/5ML PO LQCR: 5.0000 mL | Freq: Two times a day (BID) | ORAL | Status: AC | PRN

## 2012-11-12 NOTE — Patient Instructions (Addendum)
Viral Infections °A virus is a type of germ. Viruses can cause: °· Minor sore throats. °· Aches and pains. °· Headaches. °· Runny nose. °· Rashes. °· Watery eyes. °· Tiredness. °· Coughs. °· Loss of appetite. °· Feeling sick to your stomach (nausea). °· Throwing up (vomiting). °· Watery poop (diarrhea). °HOME CARE  °· Only take medicines as told by your doctor. °· Drink enough water and fluids to keep your pee (urine) clear or pale yellow. Sports drinks are a good choice. °· Get plenty of rest and eat healthy. Soups and broths with crackers or rice are fine. °GET HELP RIGHT AWAY IF:  °· You have a very bad headache. °· You have shortness of breath. °· You have chest pain or neck pain. °· You have an unusual rash. °· You cannot stop throwing up. °· You have watery poop that does not stop. °· You cannot keep fluids down. °· You or your child has a temperature by mouth above 102° F (38.9° C), not controlled by medicine. °· Your baby is older than 3 months with a rectal temperature of 102° F (38.9° C) or higher. °· Your baby is 3 months old or younger with a rectal temperature of 100.4° F (38° C) or higher. °MAKE SURE YOU:  °· Understand these instructions. °· Will watch this condition. °· Will get help right away if you are not doing well or get worse. °Document Released: 01/07/2008 Document Revised: 04/18/2011 Document Reviewed: 06/01/2010 °Exit[employer]® Patient Information ©2014 [employer]. ° °

## 2012-11-12 NOTE — Progress Notes (Signed)
Subjective:    Patient ID: Matthew Burch, male    DOB: [birth year], 37 y.o.   MRN: [id number]  HPI  37 year old white male, nonsmoker is in today as a followup from urgent care clinic. He was seen in urgent care 4 days ago with fever, chills, achiness, cough for 2 days at that point. He's been taking over-the-counter Tylenol and ibuprofen. He is also prescribed cephalexin and hydrocodone for pain. He reports the fever appears to be better but continues to spike a fever typically at night around 101. Previously, he had been around 103. No concerns of any exposure.  Review of Systems  Constitutional: Positive for fever and fatigue.  HENT: Positive for congestion.   Respiratory: Positive for cough. Negative for shortness of breath and wheezing.   Cardiovascular: Negative.   Gastrointestinal: Negative.   Endocrine: Negative.   Genitourinary: Negative.   Musculoskeletal: Positive for myalgias.  Skin: Negative.   Allergic/Immunologic: Negative.   Neurological: Negative.   Hematological: Negative.   Psychiatric/Behavioral: Negative.    Past Medical History  Diagnosis Date  . Arthritis     left shoulder  . 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 Peroneal tendonitis 04/2012    left    History   Social History  . Marital Status: Married    Spouse Name: N/A    Number of Children: N/A  . Years of Education: N/A   Occupational History  . Not on file.   Social History Main Topics  . Smoking status: Never Smoker   . Smokeless tobacco: Never Used  . Alcohol Use: Yes     Comment: 1 drink/day  . Drug Use: No  . Sexual Activity: Not on file   Other Topics Concern  . Not on file   Social History Narrative  . No narrative on file    Past Surgical History  Procedure Laterality Date  . Shoulder arthroscopy Left   . Skin graft  age 8  . Ankle arthroscopy with reconstruction Left 04/26/2012    Procedure: LEFT ANKLE ARTHROSCOPY WITH EXTENSIVE DEBRIDEMENT, REMOVAL OF LOOSE BODY AND  MICROFRACTURE; LEFT ANKLE OPEN LATERAL LIGAMENT RECONSTRUCTION; LEFT PERONEUS-LONGUS  TENOLYSIS;  AND  PERONEUS-BREVIS REPAIR ;  Surgeon: Toni Arthurs, MD;  Location: [facility];  Service: Orthopedics;  Laterality: Left;    No family history on file.  Allergies  Allergen Reactions  . Iodinated Diagnostic Agents Shortness Of Breath and Swelling    SWELLING OF EYES    Current Outpatient Prescriptions on File Prior to Visit  Medication Sig Dispense Refill  . cephALEXin (KEFLEX) 500 MG capsule Take 1 capsule (500 mg total) by mouth 4 (four) times daily.  28 capsule  0  . HYDROcodone-acetaminophen (NORCO/VICODIN) 5-325 MG per tablet Take 2 tablets by mouth every 4 (four) hours as needed for pain.  20 tablet  0   No current facility-administered medications on file prior to visit.    BP 120/60  Pulse 69  Temp(Src) 98 F (36.7 C) (Oral)  Wt 173 lb (78.472 kg)  BMI 24.82 kg/m2chart    Objective:   Physical Exam  Constitutional: He is oriented to person, place, and time. He appears well-developed and well-nourished.  HENT:  Right Ear: External ear normal.  Left Ear: External ear normal.  Nose: Nose normal.  Mouth/Throat: Oropharynx is clear and moist.  Neck: Normal range of motion. Neck supple.  Cardiovascular: Normal rate, regular rhythm and normal heart sounds.   Pulmonary/Chest: Effort normal and breath sounds normal.  Abdominal:  Soft. Bowel sounds are normal.  Musculoskeletal: Normal range of motion.  Neurological: He is alert and oriented to person, place, and time.  Skin: Skin is warm and dry.  Psychiatric: He has a normal mood and affect.      Monospot: Negative    Assessment & Plan:  Assessment: 1. Viral illness 2. Fever  Plan: Over-the-counter symptomatic treatment for relief. Continue ibuprofen and Tylenol alternating. Tussionex as needed for cough. Warned of drowsiness. Patient the office with any questions or concerns. Recheck in schedule, and as  needed.

## 2012-12-07 ENCOUNTER — Other Ambulatory Visit: Payer: Self-pay | Admitting: Emergency Medicine

## 2012-12-07 ENCOUNTER — Ambulatory Visit
Admission: RE | Admit: 2012-12-07 | Discharge: 2012-12-07 | Disposition: A | Discharge status: Home / Self Care | Payer: Managed Care, Other (non HMO) | Source: Ambulatory Visit | Attending: Emergency Medicine | Admitting: Emergency Medicine

## 2012-12-07 DIAGNOSIS — R059 Cough, unspecified: Secondary | ICD-10-CM

## 2012-12-07 DIAGNOSIS — R05 Cough: Secondary | ICD-10-CM

## 2012-12-07 DIAGNOSIS — R062 Wheezing: Secondary | ICD-10-CM

## 2012-12-07 DIAGNOSIS — J329 Chronic sinusitis, unspecified: Secondary | ICD-10-CM

## 2012-12-13 ENCOUNTER — Other Ambulatory Visit: Payer: Self-pay

## 2015-05-13 ENCOUNTER — Other Ambulatory Visit: Payer: Self-pay | Admitting: Urology

## 2015-06-04 ENCOUNTER — Encounter (HOSPITAL_BASED_OUTPATIENT_CLINIC_OR_DEPARTMENT_OTHER): Payer: Self-pay | Admitting: *Deleted

## 2015-06-04 NOTE — Progress Notes (Signed)
Pt instructed npo pmn 5/4.  [facility] 0600.  Needs hgb on arrival.

## 2015-06-11 NOTE — Anesthesia Preprocedure Evaluation (Addendum)
Anesthesia Evaluation  Patient identified by MRN, date of birth, ID band Patient awake    Reviewed: Allergy & Precautions, NPO status , Patient's Chart, lab work & pertinent test results  Airway Mallampati: I  TM Distance: >3 FB Neck ROM: Full    Dental  (+) Teeth Intact, Dental Advisory Given   Pulmonary neg pulmonary ROS,    breath sounds clear to auscultation       Cardiovascular negative cardio ROS   Rhythm:Regular Rate:Normal     Neuro/Psych negative neurological ROS  negative psych ROS   GI/Hepatic negative GI ROS, Neg liver ROS,   Endo/Other  negative endocrine ROS  Renal/GU negative Renal ROS  negative genitourinary   Musculoskeletal  (+) Arthritis ,   Abdominal   Peds negative pediatric ROS (+)  Hematology negative hematology ROS (+)   Anesthesia Other Findings   Reproductive/Obstetrics negative OB ROS                            Anesthesia Physical Anesthesia Plan  ASA: II  Anesthesia Plan: General   Post-op Pain Management:    Induction: Intravenous  Airway Management Planned: LMA  Additional Equipment:   Intra-op Plan:   Post-operative Plan: Extubation in OR  Informed Consent: I have reviewed the patients History and Physical, chart, labs and discussed the procedure including the risks, benefits and alternatives for the proposed anesthesia with the patient or authorized representative who has indicated his/her understanding and acceptance.   Dental advisory given  Plan Discussed with: CRNA  Anesthesia Plan Comments:         Anesthesia Quick Evaluation

## 2015-06-12 ENCOUNTER — Encounter (HOSPITAL_BASED_OUTPATIENT_CLINIC_OR_DEPARTMENT_OTHER)
Admission: RE | Disposition: A | Discharge status: Home / Self Care | Payer: Self-pay | Source: Ambulatory Visit | Attending: Urology

## 2015-06-12 ENCOUNTER — Ambulatory Visit (HOSPITAL_BASED_OUTPATIENT_CLINIC_OR_DEPARTMENT_OTHER)
Admission: RE | Admit: 2015-06-12 | Discharge: 2015-06-12 | Disposition: A | Discharge status: Home / Self Care | Payer: Managed Care, Other (non HMO) | Source: Ambulatory Visit | Attending: Urology | Admitting: Urology

## 2015-06-12 ENCOUNTER — Ambulatory Visit (HOSPITAL_BASED_OUTPATIENT_CLINIC_OR_DEPARTMENT_OTHER): Payer: Managed Care, Other (non HMO) | Admitting: Anesthesiology

## 2015-06-12 ENCOUNTER — Telehealth: Payer: Self-pay | Admitting: Urology

## 2015-06-12 ENCOUNTER — Encounter (HOSPITAL_BASED_OUTPATIENT_CLINIC_OR_DEPARTMENT_OTHER): Payer: Self-pay

## 2015-06-12 DIAGNOSIS — Z302 Encounter for sterilization: Secondary | ICD-10-CM | POA: Diagnosis not present

## 2015-06-12 DIAGNOSIS — N4341 Spermatocele of epididymis, single: Secondary | ICD-10-CM

## 2015-06-12 HISTORY — DX: Dermatitis, unspecified: L30.9

## 2015-06-12 HISTORY — PX: SPERMATOCELECTOMY: SHX2420

## 2015-06-12 HISTORY — DX: Psoriasis, unspecified: L40.9

## 2015-06-12 HISTORY — PX: VASECTOMY: SHX75

## 2015-06-12 HISTORY — DX: Spermatocele of epididymis, unspecified: N43.40

## 2015-06-12 LAB — POCT HEMOGLOBIN-HEMACUE: Hemoglobin: 14.3 g/dL (ref 13.0–17.0)

## 2015-06-12 SURGERY — EXCISION, SPERMATOCELE
Anesthesia: General | Site: Scrotum | Laterality: Left

## 2015-06-12 MED ORDER — ONDANSETRON 8 MG PO TBDP: 8.0000 mg | ORAL_TABLET | Freq: Three times a day (TID) | ORAL | Status: AC | PRN

## 2015-06-12 MED ORDER — LIDOCAINE HCL (CARDIAC) 20 MG/ML IV SOLN
INTRAVENOUS | Status: AC
  Filled 2015-06-12: qty 5

## 2015-06-12 MED ORDER — LIDOCAINE HCL (CARDIAC) 20 MG/ML IV SOLN
INTRAVENOUS | Status: DC | PRN
  Administered 2015-06-12: 100 mg via INTRAVENOUS

## 2015-06-12 MED ORDER — CEFAZOLIN SODIUM-DEXTROSE 2-4 GM/100ML-% IV SOLN
INTRAVENOUS | Status: AC
  Filled 2015-06-12: qty 100

## 2015-06-12 MED ORDER — KETOROLAC TROMETHAMINE 30 MG/ML IJ SOLN
INTRAMUSCULAR | Status: AC
  Filled 2015-06-12: qty 1

## 2015-06-12 MED ORDER — PROMETHAZINE HCL 25 MG/ML IJ SOLN
6.2500 mg | INTRAMUSCULAR | Status: DC | PRN
  Filled 2015-06-12: qty 1

## 2015-06-12 MED ORDER — ONDANSETRON HCL 4 MG/2ML IJ SOLN
INTRAMUSCULAR | Status: AC
  Filled 2015-06-12: qty 2

## 2015-06-12 MED ORDER — MIDAZOLAM HCL 5 MG/5ML IJ SOLN
INTRAMUSCULAR | Status: DC | PRN
Start: 2015-06-12 — End: 2015-06-12
  Administered 2015-06-12: 2 mg via INTRAVENOUS

## 2015-06-12 MED ORDER — BUPIVACAINE-EPINEPHRINE (PF) 0.5% -1:200000 IJ SOLN
INTRAMUSCULAR | Status: DC | PRN
  Administered 2015-06-12: 20 mL via PERINEURAL

## 2015-06-12 MED ORDER — KETOROLAC TROMETHAMINE 30 MG/ML IJ SOLN
INTRAMUSCULAR | Status: DC | PRN
  Administered 2015-06-12: 30 mg via INTRAVENOUS

## 2015-06-12 MED ORDER — FENTANYL CITRATE (PF) 100 MCG/2ML IJ SOLN
INTRAMUSCULAR | Status: DC | PRN
  Administered 2015-06-12 (×2): 50 ug via INTRAVENOUS

## 2015-06-12 MED ORDER — LACTATED RINGERS IV SOLN
INTRAVENOUS | Status: DC
  Administered 2015-06-12: 07:00:00 via INTRAVENOUS
  Filled 2015-06-12: qty 1000

## 2015-06-12 MED ORDER — CEFAZOLIN SODIUM-DEXTROSE 2-4 GM/100ML-% IV SOLN
2.0000 g | INTRAVENOUS | Status: AC
  Administered 2015-06-12: 2 g via INTRAVENOUS
  Filled 2015-06-12: qty 100

## 2015-06-12 MED ORDER — GLYCOPYRROLATE 0.2 MG/ML IJ SOLN
INTRAMUSCULAR | Status: AC
  Filled 2015-06-12: qty 1

## 2015-06-12 MED ORDER — LACTATED RINGERS IV SOLN
INTRAVENOUS | Status: DC
  Filled 2015-06-12: qty 1000

## 2015-06-12 MED ORDER — GLYCOPYRROLATE 0.2 MG/ML IJ SOLN
INTRAMUSCULAR | Status: DC | PRN
  Administered 2015-06-12: 0.2 mg via INTRAVENOUS

## 2015-06-12 MED ORDER — DEXAMETHASONE SODIUM PHOSPHATE 4 MG/ML IJ SOLN
INTRAMUSCULAR | Status: DC | PRN
  Administered 2015-06-12: 10 mg via INTRAVENOUS

## 2015-06-12 MED ORDER — PROPOFOL 10 MG/ML IV BOLUS
INTRAVENOUS | Status: AC
  Filled 2015-06-12: qty 40

## 2015-06-12 MED ORDER — HYDROMORPHONE HCL 1 MG/ML IJ SOLN
0.2500 mg | INTRAMUSCULAR | Status: DC | PRN
  Filled 2015-06-12: qty 1

## 2015-06-12 MED ORDER — PROPOFOL 10 MG/ML IV BOLUS
INTRAVENOUS | Status: DC | PRN
  Administered 2015-06-12: 300 mg via INTRAVENOUS

## 2015-06-12 MED ORDER — MEPERIDINE HCL 25 MG/ML IJ SOLN
6.2500 mg | INTRAMUSCULAR | Status: DC | PRN
  Filled 2015-06-12: qty 1

## 2015-06-12 MED ORDER — OXYCODONE HCL 10 MG PO TABS: 10.0000 mg | ORAL_TABLET | ORAL | Status: AC | PRN

## 2015-06-12 MED ORDER — MIDAZOLAM HCL 2 MG/2ML IJ SOLN
INTRAMUSCULAR | Status: AC
  Filled 2015-06-12: qty 2

## 2015-06-12 MED ORDER — DEXAMETHASONE SODIUM PHOSPHATE 10 MG/ML IJ SOLN
INTRAMUSCULAR | Status: AC
  Filled 2015-06-12: qty 1

## 2015-06-12 MED ORDER — FENTANYL CITRATE (PF) 100 MCG/2ML IJ SOLN
INTRAMUSCULAR | Status: AC
  Filled 2015-06-12: qty 2

## 2015-06-12 MED ORDER — ONDANSETRON HCL 4 MG/2ML IJ SOLN
INTRAMUSCULAR | Status: DC | PRN
Start: 2015-06-12 — End: 2015-06-12
  Administered 2015-06-12: 4 mg via INTRAVENOUS

## 2015-06-12 SURGICAL SUPPLY — 52 items
BLADE CLIPPER SURG (BLADE) ×3 IMPLANT
BLADE SURG 15 STRL LF DISP TIS (BLADE) ×2 IMPLANT
BLADE SURG 15 STRL SS (BLADE) ×1
BNDG GAUZE ELAST 4 BULKY (GAUZE/BANDAGES/DRESSINGS) ×3 IMPLANT
CANISTER SUCTION 1200CC (MISCELLANEOUS) IMPLANT
CANISTER SUCTION 2500CC (MISCELLANEOUS) ×3 IMPLANT
CLEANER CAUTERY TIP 5X5 PAD (MISCELLANEOUS) ×2 IMPLANT
CLOTH BEACON ORANGE TIMEOUT ST (SAFETY) ×3 IMPLANT
COVER BACK TABLE 60X90IN (DRAPES) ×3 IMPLANT
COVER MAYO STAND STRL (DRAPES) ×3 IMPLANT
DRAIN PENROSE 18X1/4 LTX STRL (WOUND CARE) IMPLANT
DRAPE LAPAROTOMY 100X72 PEDS (DRAPES) ×3 IMPLANT
ELECT NEEDLE TIP 2.8 STRL (NEEDLE) ×3 IMPLANT
ELECT REM PT RETURN 9FT ADLT (ELECTROSURGICAL) ×3
ELECTRODE REM PT RTRN 9FT ADLT (ELECTROSURGICAL) ×2 IMPLANT
GLOVE BIO SURGEON STRL SZ8 (GLOVE) ×3 IMPLANT
GLOVE BIOGEL M 8.0 STRL (GLOVE) ×3 IMPLANT
GLOVE BIOGEL PI IND STRL 7.0 (GLOVE) ×4 IMPLANT
GLOVE BIOGEL PI IND STRL 7.5 (GLOVE) ×2 IMPLANT
GLOVE BIOGEL PI INDICATOR 7.0 (GLOVE) ×2
GLOVE BIOGEL PI INDICATOR 7.5 (GLOVE) ×1
GLOVE SURG SS PI 7.0 STRL IVOR (GLOVE) ×6 IMPLANT
GOWN W/2 COTTON TOWELS 2 STD (GOWNS) ×6 IMPLANT
GOWN W/COTTON TOWEL STD LRG (GOWNS) ×3 IMPLANT
GOWN XL W/COTTON TOWEL STD (GOWNS) ×3 IMPLANT
IV NS IRRIG 3000ML ARTHROMATIC (IV SOLUTION) IMPLANT
KIT ROOM TURNOVER WOR (KITS) ×3 IMPLANT
MANIFOLD NEPTUNE II (INSTRUMENTS) IMPLANT
NEEDLE 27GAX1X1/2 (NEEDLE) ×3 IMPLANT
NEEDLE HYPO 22GX1.5 SAFETY (NEEDLE) ×3 IMPLANT
NS IRRIG 500ML POUR BTL (IV SOLUTION) ×3 IMPLANT
PACK BASIN DAY SURGERY FS (CUSTOM PROCEDURE TRAY) ×3 IMPLANT
PAD CLEANER CAUTERY TIP 5X5 (MISCELLANEOUS) ×1
PENCIL BUTTON HOLSTER BLD 10FT (ELECTRODE) ×3 IMPLANT
SPONGE GAUZE 4X4 12PLY STER LF (GAUZE/BANDAGES/DRESSINGS) ×3 IMPLANT
SUPPORT SCROTAL LG STRP (MISCELLANEOUS) IMPLANT
SUPPORT SCROTAL MED ADLT STRP (MISCELLANEOUS) ×3 IMPLANT
SUT CHROMIC 3 0 PS 2 (SUTURE) ×3 IMPLANT
SUT CHROMIC 3 0 SH 27 (SUTURE) ×3 IMPLANT
SUT SILK 2 0 TIES 17X18 (SUTURE) ×1
SUT SILK 2-0 18XBRD TIE BLK (SUTURE) ×2 IMPLANT
SUT VIC AB 4-0 RB1 27 (SUTURE)
SUT VIC AB 4-0 RB1 27X BRD (SUTURE) IMPLANT
SUT VICRYL 6 0 RB 1 (SUTURE) IMPLANT
SYR BULB IRRIGATION 50ML (SYRINGE) ×3 IMPLANT
SYR CONTROL 10ML LL (SYRINGE) ×3 IMPLANT
TOWEL OR 17X24 6PK STRL BLUE (TOWEL DISPOSABLE) ×6 IMPLANT
TRAY DSU PREP LF (CUSTOM PROCEDURE TRAY) ×3 IMPLANT
TUBE CONNECTING 12X1/4 (SUCTIONS) ×3 IMPLANT
WATER STERILE IRR 3000ML UROMA (IV SOLUTION) IMPLANT
WATER STERILE IRR 500ML POUR (IV SOLUTION) IMPLANT
YANKAUER SUCT BULB TIP NO VENT (SUCTIONS) ×3 IMPLANT

## 2015-06-12 NOTE — Anesthesia Procedure Notes (Signed)
Procedure Name: LMA Insertion Date/Time: 06/12/2015 7:40 AM Performed by: Norva PavlovALLAWAY, Haly Feher G Pre-anesthesia Checklist: Patient identified, Emergency Drugs available, Suction available and Patient being monitored Patient Re-evaluated:Patient Re-evaluated prior to inductionOxygen Delivery Method: Circle System Utilized Preoxygenation: Pre-oxygenation with 100% oxygen Intubation Type: IV induction Ventilation: Mask ventilation without difficulty LMA: LMA inserted LMA Size: 5.0 Number of attempts: 1 Airway Equipment and Method: bite block Placement Confirmation: positive ETCO2 Tube secured with: Tape Dental Injury: Teeth and Oropharynx as per pre-operative assessment

## 2015-06-12 NOTE — Transfer of Care (Signed)
Immediate Anesthesia Transfer of Care Note  Patient: Matthew Burch  Procedure(s) Performed: Procedure(s) (LRB): LEFT SPERMATOCELECTOMY  (Left) BILATERAL VASECTOMY (Bilateral)  Patient Location: PACU  Anesthesia Type: General  Level of Consciousness: awake, alert  and oriented  Airway & Oxygen Therapy: Patient Spontanous Breathing and Patient connected to nasal cannula oxygen  Post-op Assessment: Report given to PACU RN and Post -op Vital signs reviewed and stable  Post vital signs: Reviewed and stable  Complications: No apparent anesthesia complicationsLast Vitals:  Filed Vitals:   06/12/15 0610  BP: 131/86  Pulse: 52  Temp: 36.5 C  Resp: 16    Last Pain:  Filed Vitals:   06/12/15 0632  PainSc: 2       Patients Stated Pain Goal: 5 (06/12/15 0630)

## 2015-06-12 NOTE — Anesthesia Postprocedure Evaluation (Signed)
Anesthesia Post Note  Patient: Kenn FileMichael Jablonowski  Procedure(s) Performed: Procedure(s) (LRB): LEFT SPERMATOCELECTOMY  (Left) BILATERAL VASECTOMY (Bilateral)  Patient location during evaluation: PACU Anesthesia Type: General Level of consciousness: awake and alert Pain management: pain level controlled Vital Signs Assessment: post-procedure vital signs reviewed and stable Respiratory status: spontaneous breathing, nonlabored ventilation, respiratory function stable and patient connected to nasal cannula oxygen Cardiovascular status: blood pressure returned to baseline and stable Postop Assessment: no signs of nausea or vomiting Anesthetic complications: no    Last Vitals:  Filed Vitals:   06/12/15 0900 06/12/15 0915  BP: 122/81 127/80  Pulse: 70 72  Temp:    Resp: 13 12    Last Pain:  Filed Vitals:   06/12/15 0917  PainSc: 2                  Shelton SilvasKevin D Micha Dosanjh

## 2015-06-12 NOTE — H&P (Signed)
History of Present Illness Matthew Burch is a 40 year old male who desires vasectomy. He is married and has 3 sons. He is in good health with no significant past urologic history. He has had prior local anesthetic without adverse reaction and has no history of bleeding diathesis.  He reports to me today that he has had a spermatocele diagnosed when he was in his teens. It is exquisitely tender to touch and is quite bothered to him. He inquired as to whether this could be treated.   Past Medical History Problems  1. History of No significant past medical history  Surgical History Problems  1. History of Ankle Surgery  Current Meds 1. No Reported Medications Recorded  Allergies Medication  1. No Known Drug Allergies Non-Medication  2. Contrast Dye  Family History Problems  1. No pertinent family history : Mother  Social History Problems    Alcohol use (Z78.9)   Caffeine use (F15.90)   Married   Never a smoker  Review of Systems Genitourinary, constitutional, skin, eye, otolaryngeal, hematologic/lymphatic, cardiovascular, pulmonary, endocrine, musculoskeletal, gastrointestinal, neurological and psychiatric system(s) were reviewed and pertinent findings if present are noted and are otherwise negative.  Genitourinary: nocturia.    Vitals Vital Signs [Data Includes: Last 1 Day]  Recorded: 05Apr2017 10:05AM  Height: 5 ft 10 in Weight: 170 lb  BMI Calculated: 24.39 BSA Calculated: 1.95 Blood Pressure: 133 / 72 Heart Rate: 57  Physical Exam Constitutional: Well nourished and well developed . No acute distress.   ENT:. The ears and nose are normal in appearance.   Neck: The appearance of the neck is normal and no neck mass is present.   Pulmonary: No respiratory distress and normal respiratory rhythm and effort.   Cardiovascular: Heart rate and rhythm are normal . No peripheral edema.   Abdomen: The abdomen is soft and nontender. No masses are palpated. No CVA  tenderness. No hernias are palpable. No hepatosplenomegaly noted.   Genitourinary: Examination of the penis demonstrates no discharge, no masses, no lesions and a normal meatus. The penis is circumcised. The scrotum is without lesions. The right vas deferens is is palpably normal. The left vas deferens is palpably normal. The right epididymis is palpably normal and non-tender. The left epididymis reveals a round, tender area associated with the head. The right testis is non-tender and without masses. The left testis is non-tender and without masses.   Lymphatics: The femoral and inguinal nodes are not enlarged or tender.   Skin: Normal skin turgor, no visible rash and no visible skin lesions.   Neuro/Psych:. Mood and affect are appropriate.      Results/Data  The following clinical lab reports were reviewed:  UA: Clear.    Assessment Assessed  1. Encounter for general counseling and advice on contraceptive management (Z30.09) 2. 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 We discussed the treatment for this is surgical excision and I also have made him aware that there is a slight risk of persistent pain. We discussed the incision used, the risks and complications, the outpatient nature of the procedure as well as the anticipated postoperative course and probability of success.     We discussed the fact that the patient must absolutely be sure that he does not want to father a child under any circumstances and was counseled to talk with his partner regarding this decision. We then discussed the potential risks and complications of this procedure, including but not  limited to epididymitis with typically transient discomfort and swelling after the procedure, which can occur on one or both sides; sperm granuloma, which was described in detail as being caused by the presence of some sperm leaking out from the severed ends of  the vasa, causing an inflammatory reaction and oftentimes resulting in a small round area of scar that may persist; hematoma and bleeding into the scrotum, which can be significant and possibly require a second incision to drain the accumulated blood, as well as how to prevent this by decreased activity level. We discussed superficial skin infection, which is rare, and abscess formation, which is exceedingly rare and may require drainage as well. Finally, we went over recanalization, which can occur in 1 in 2000 cases and how to determine this and prevent this from occurring.  The patient was given written information regarding all of these risks and complications. Also, informed of preoperative preparation of shaving the scrotum, and given written information indicating that immediately after the procedure continued birth control needs to be used until 2 semen specimens are noted to be free of sperm.    He would like to undergo left spermatocelectomy and at the same time have his vasectomy performed.    Plan   He will be scheduled for left spermatocelectomy and bilateral vasectomy.

## 2015-06-12 NOTE — Discharge Instructions (Signed)
Scrotal surgery postoperative instructions ° °Wound: ° °In most cases your incision will have absorbable sutures that will dissolve within the first 10-20 days. Some will fall out even earlier. Expect some redness as the sutures dissolved but this should occur only around the sutures. If there is generalized redness, especially with increasing pain or swelling, let us know. The scrotum will very likely get "black and blue" as the blood in the tissues spread. Sometimes the whole scrotum will turn colors. The black and blue is followed by a yellow and brown color. In time, all the discoloration will go away. In some cases some firm swelling in the area of the testicle may persist for up to 4-6 weeks after the surgery and is considered normal in most cases. ° °Diet: ° °You may return to your normal diet within 24 hours following your surgery. You may note some mild nausea and possibly vomiting the first 6-8 hours following surgery. This is usually due to the side effects of anesthesia, and will disappear quite soon. I would suggest clear liquids and a very light meal the first evening following your surgery. ° °Activity: ° °Your physical activity should be restricted the first 48 hours. During that time you should remain relatively inactive, moving about only when necessary. During the first 7-10 days following surgery he should avoid lifting any heavy objects (anything greater than 15 pounds), and avoid strenuous exercise. If you work, ask us specifically about your restrictions, both for work and home. We will write a note to your employer if needed. ° °You should plan to wear a tight pair of jockey shorts or an athletic supporter for the first 4-5 days, even to sleep. This will keep the scrotum immobilized to some degree and keep the swelling down. ° °Ice packs should be placed on and off over the scrotum for the first 48 hours. Frozen peas or corn in a ZipLock bag can be frozen, used and re-frozen. Fifteen minutes  on and 15 minutes off is a reasonable schedule. The ice is a good pain reliever and keeps the swelling down. ° °Hygiene: ° °You may shower 48 hours after your surgery. Tub bathing should be restricted until the seventh day. ° ° ° ° ° ° ° ° ° °Medication: ° °You will be sent home with some type of pain medication. In many cases you will be sent home with a narcotic pain pill (hydrococone or oxycodone). If the pain is not too bad, you may take either Tylenol (acetaminophen) or Advil (ibuprofen) which contain no narcotic agents, and might be tolerated a little better, with fewer side effects. If the pain medication you are sent home with does not control the pain, you will have to let us know. Some narcotic pain medications cannot be given or refilled by a phone call to a pharmacy. ° °Problems you should report to us: ° °· Fever of 101.0 degrees Fahrenheit or greater. °· Moderate or severe swelling under the skin incision or involving the scrotum. °· Drug reaction such as hives, a rash, nausea or vomiting. °·  ° ° °Post Anesthesia Home Care Instructions ° °Activity: °Get plenty of rest for the remainder of the day. A responsible adult should stay with you for 24 hours following the procedure.  °For the next 24 hours, DO NOT: °-Drive a car °-Operate machinery °-Drink alcoholic beverages °-Take any medication unless instructed by your physician °-Make any legal decisions or sign important papers. ° °Meals: °Start with liquid foods such as   gelatin or soup. Progress to regular foods as tolerated. Avoid greasy, spicy, heavy foods. If nausea and/or vomiting occur, drink only clear liquids until the nausea and/or vomiting subsides. Call your physician if vomiting continues. ° °Special Instructions/Symptoms: °Your throat may feel dry or sore from the anesthesia or the breathing tube placed in your throat during surgery. If this causes discomfort, gargle with warm salt water. The discomfort should disappear within 24  hours. ° °If you had a scopolamine patch placed behind your ear for the management of post- operative nausea and/or vomiting: ° °1. The medication in the patch is effective for 72 hours, after which it should be removed.  Wrap patch in a tissue and discard in the trash. Wash hands thoroughly with soap and water. °2. You may remove the patch earlier than 72 hours if you experience unpleasant side effects which may include dry mouth, dizziness or visual disturbances. °3. Avoid touching the patch. Wash your hands with soap and water after contact with the patch. °  ° °

## 2015-06-12 NOTE — Op Note (Signed)
PATIENT:  Matthew FileMichael Burch  PRE-OPERATIVE DIAGNOSIS: 1. Symptomatic left spermatocele 2. Desires sterilization   POST-OPERATIVE DIAGNOSIS: Same  PROCEDURE: 1. Left spermatocelectomy 2. Bilateral vasectomy  SURGEON:  Garnett FarmMark C Deshay Kirstein  INDICATION: Matthew Burch is a 40 year old male who desires sterility. He is married with children. He also has had a left epididymal cyst/spermatocele that has been symptomatic for some time and we discussed managing both of these issues under anesthesia as an outpatient. He understands and has elected to proceed.  ANESTHESIA:  General  EBL:  Minimal  DRAINS: None  LOCAL MEDICATIONS USED:  1/2% Marcaine with epinephrine (20 mL)  SPECIMEN:  Left epididymal cyst to pathology  Description of procedure: After informed consent the patient was taken to the operating room and placed on the table in a supine position. General anesthesia was then administered. Once fully anesthetized the genitalia were sterilely prepped and draped in standard fashion. An official timeout was then performed.  A midline median raphae scrotal incision was then made and carried down to the parietal tunica vaginalis on the left-hand side which was opened and drained of a small amount of fluid. The testicle was delivered and the appendix testis was fulgurated and removed. He had a small cystic lesion involving the head of the epididymis. This was sharply dissected and excised intact. It was sent to pathology.  The left vas was then identified in the spermatic cord above the testicle and cleared of surrounding tissue. 2-0 silk suture was used to ligate the vas proximally and distally and the segment in between was excised. The needle tip Bovie was placed within the lumen of the vas and used to fulgurate this. I then turned my attention to the right vas and it was treated in identical fashion.  The left testicle was then replaced in its normal anatomic position and 3-0 chromic suture was used  to close the parietal tunica vaginalis in a running, locking fashion. I then injected local anesthetic in the subcutaneous tissue and closed the skin with a running 3-0 chromic suture. Neosporin, sterile gauze and a fluffed Curlex were applied as well as a scrotal support and the patient was awakened and taken to the recovery room in stable and satisfactory condition. He tolerated procedure well no intraoperative complications.  PLAN OF CARE: Discharge to home after PACU  PATIENT DISPOSITION:  PACU - hemodynamically stable.

## 2015-06-12 NOTE — Telephone Encounter (Signed)
Patient called with nausea associated with pain medication.

## 2015-06-15 ENCOUNTER — Encounter (HOSPITAL_BASED_OUTPATIENT_CLINIC_OR_DEPARTMENT_OTHER): Payer: Self-pay | Admitting: Urology

## 2016-06-24 ENCOUNTER — Ambulatory Visit
Admission: RE | Admit: 2016-06-24 | Discharge: 2016-06-24 | Disposition: A | Discharge status: Home / Self Care | Payer: Commercial Managed Care - PPO | Source: Ambulatory Visit | Attending: Emergency Medicine | Admitting: Emergency Medicine

## 2016-06-24 ENCOUNTER — Other Ambulatory Visit: Payer: Self-pay | Admitting: Emergency Medicine

## 2016-06-24 DIAGNOSIS — M79605 Pain in left leg: Secondary | ICD-10-CM

## 2016-07-27 ENCOUNTER — Other Ambulatory Visit: Payer: Self-pay | Admitting: Emergency Medicine

## 2016-07-27 DIAGNOSIS — IMO0002 Reserved for concepts with insufficient information to code with codable children: Secondary | ICD-10-CM

## 2016-07-27 DIAGNOSIS — R229 Localized swelling, mass and lump, unspecified: Principal | ICD-10-CM

## 2016-08-05 ENCOUNTER — Ambulatory Visit
Admission: RE | Admit: 2016-08-05 | Discharge: 2016-08-05 | Disposition: A | Discharge status: Home / Self Care | Payer: Commercial Managed Care - PPO | Source: Ambulatory Visit | Attending: Emergency Medicine | Admitting: Emergency Medicine

## 2016-08-05 DIAGNOSIS — IMO0002 Reserved for concepts with insufficient information to code with codable children: Secondary | ICD-10-CM

## 2016-08-05 DIAGNOSIS — R229 Localized swelling, mass and lump, unspecified: Principal | ICD-10-CM

## 2016-08-05 MED ORDER — GADOBENATE DIMEGLUMINE 529 MG/ML IV SOLN
16.0000 mL | Freq: Once | INTRAVENOUS | Status: AC | PRN
  Administered 2016-08-05: 16 mL via INTRAVENOUS

## 2019-01-04 IMAGING — MR MR [PERSON_NAME] LOW WO/W CM*L*
8 of 9 series · 34 of 40 positions shown · IV contrast (multihance)
Comparison: Radiograph 06/24/2016

CLINICAL DATA: Palpable soft tissue mass involving the anterior
aspect of the left mid tibia.

EXAM:
MRI OF LOWER LEFT EXTREMITY WITHOUT AND WITH CONTRAST
TECHNIQUE: Multiplanar, multisequence MR imaging of the left lower extremity
was performed both before and after administration of intravenous
contrast.
CONTRAST:  16mL MULTIHANCE GADOBENATE DIMEGLUMINE 529 MG/ML IV SOLN

[Series 4: T1 · axial · 8.0mm · 0.55mm/px · z∈[-156,+169]mm · 5 of 32 slices shown (1 of 2)]
[im 1/32]
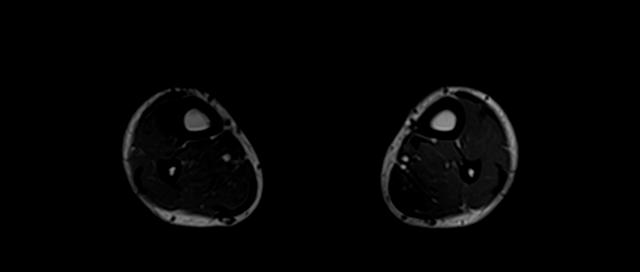
[im 8/32]
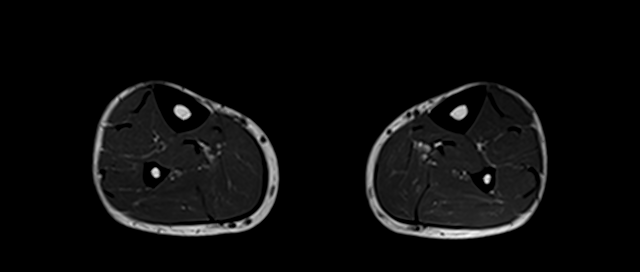
[im 16/32]
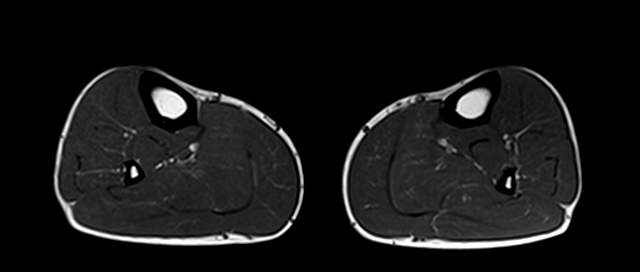
[im 24/32]
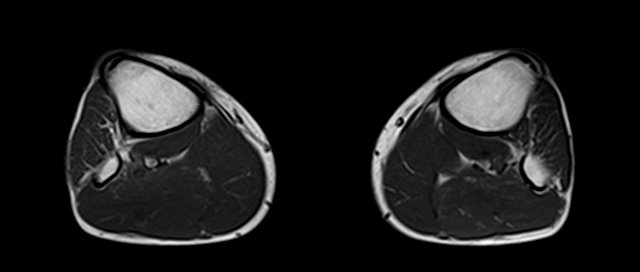
[im 32/32]
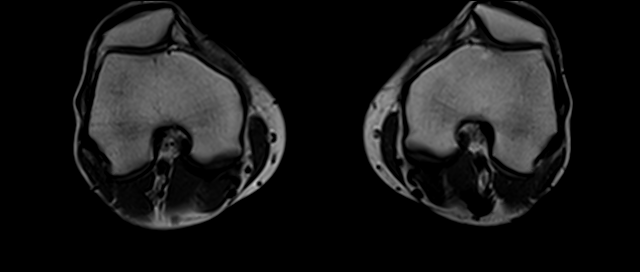

[Series 5: T2 fat-sat · axial · 8.0mm · 1.09mm/px · z∈[-156,+169]mm · 5 of 32 slices shown (1 of 2)]
[im 1/32]
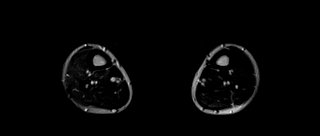
[im 8/32]
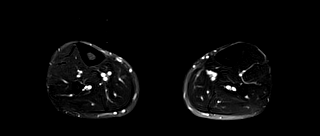
[im 16/32]
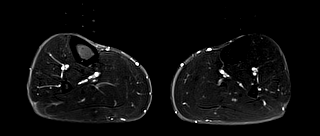
[im 24/32]
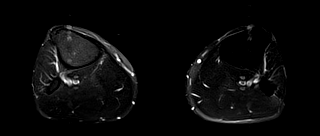
[im 32/32]
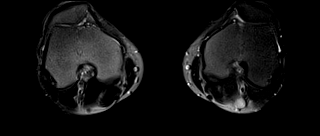

[Series 6: T1 · sagittal · 6.0mm · 0.59mm/px · 3 of 19 slices shown (2 of 2)]
[im 1/19]
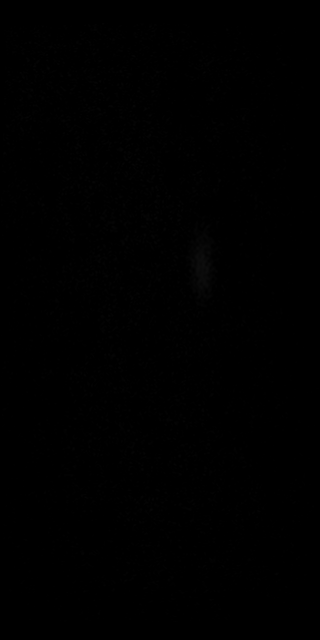
[im 10/19]
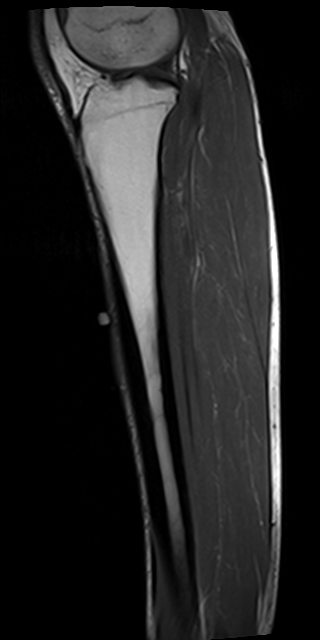
[im 19/19]
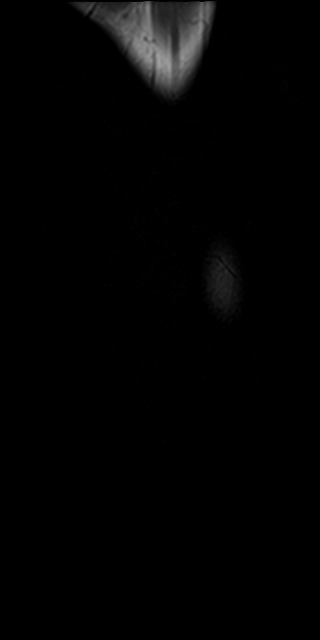

[Series 7: T2 fat-sat · sagittal · 6.0mm · 1.09mm/px · 2 of 10 slices shown (2 of 2)]
[im 1/10]
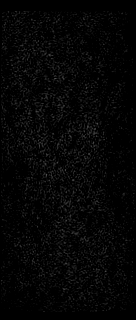
[im 10/10]
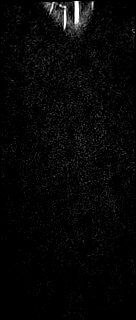

[Series 8: STIR · coronal · 4.0mm · 1.16mm/px · 5 of 28 slices shown]
[im 1/28]
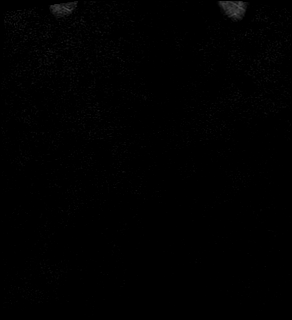
[im 7/28]
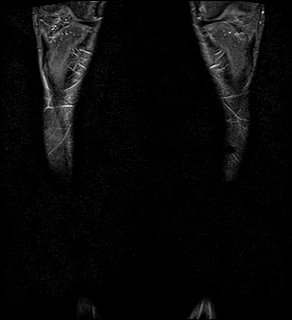
[im 14/28]
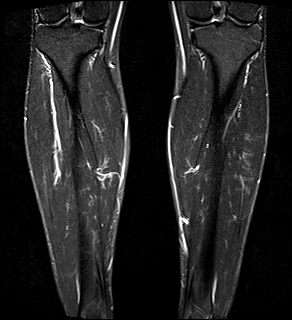
[im 21/28]
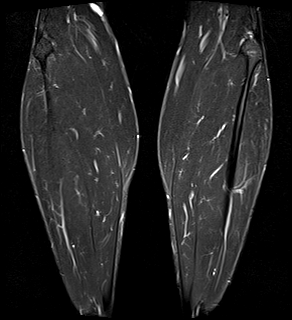
[im 28/28]
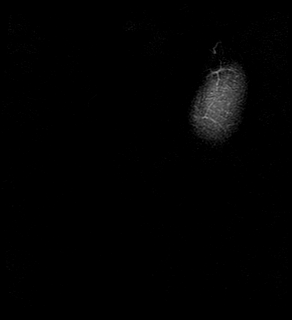

[Series 9: T1 fat-sat · axial · 8.0mm · 0.55mm/px · z∈[-156,+169]mm · 6 of 32 slices shown]
[im 1/32]
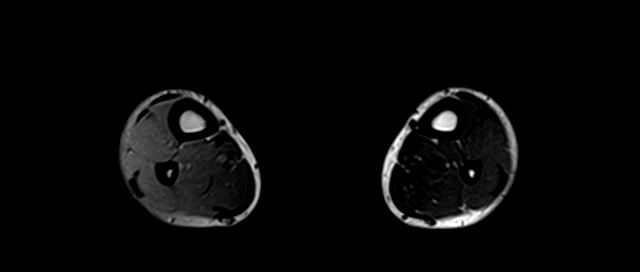
[im 7/32]
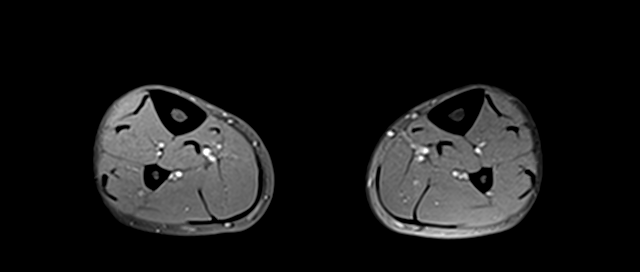
[im 13/32]
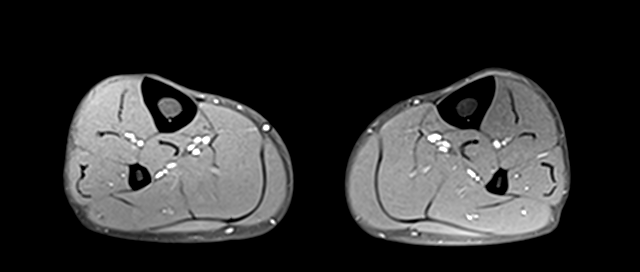
[im 19/32]
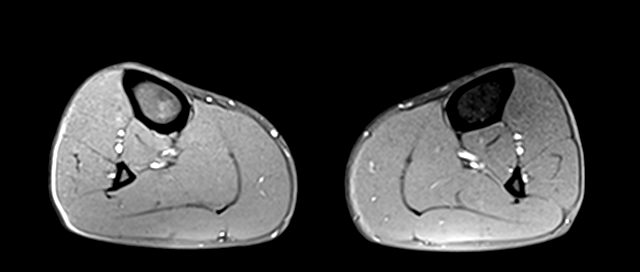
[im 25/32]
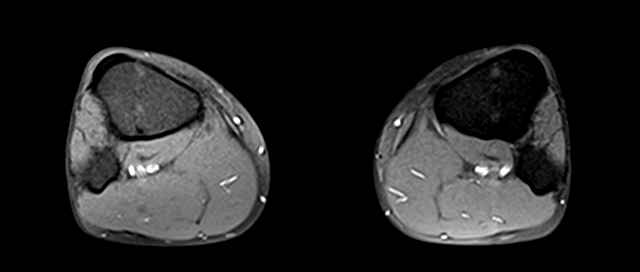
[im 32/32]
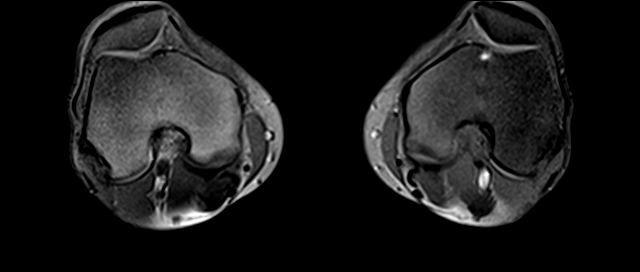

[Series 10: post fs axial · axial · 8.0mm · 0.55mm/px · z∈[-156,+169]mm · 6 of 32 slices shown]
[im 1/32]
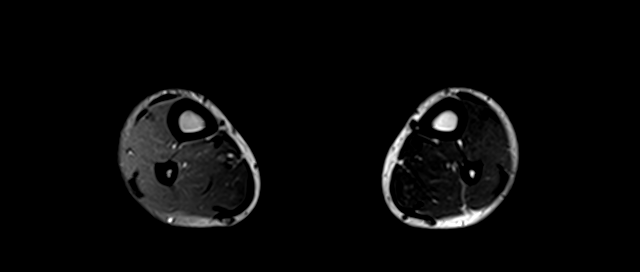
[im 7/32]
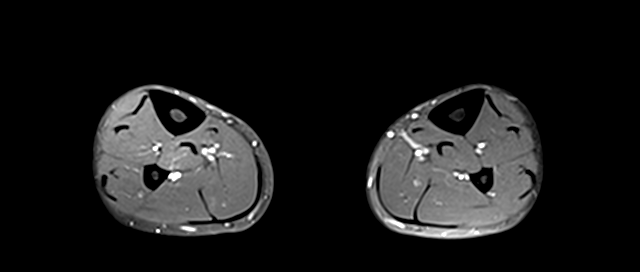
[im 13/32]
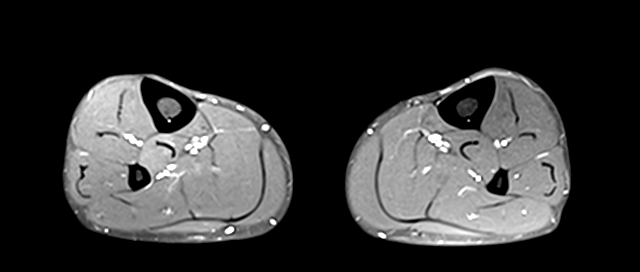
[im 19/32]
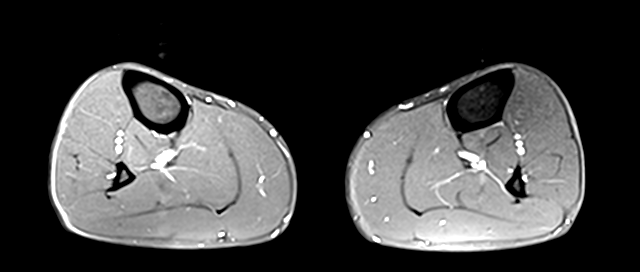
[im 25/32]
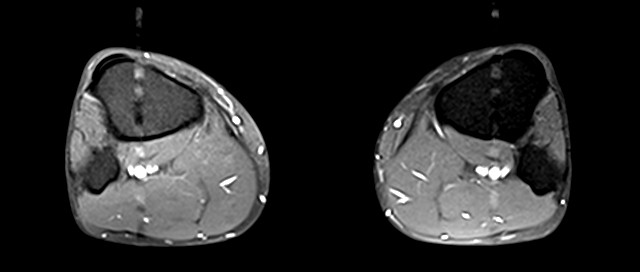
[im 32/32]
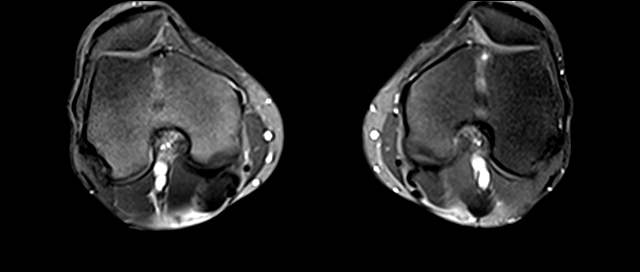

[Series 11: post fs cor · coronal · 4.0mm · 0.58mm/px · 2 of 28 slices shown]
[im 1/28]
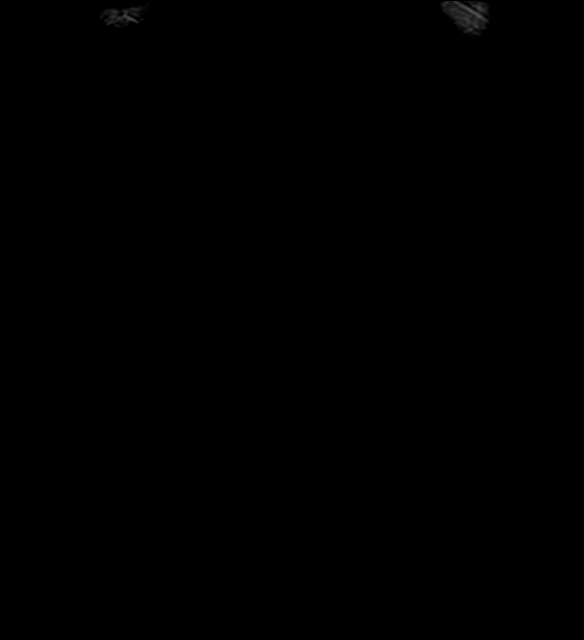
[im 7/28]
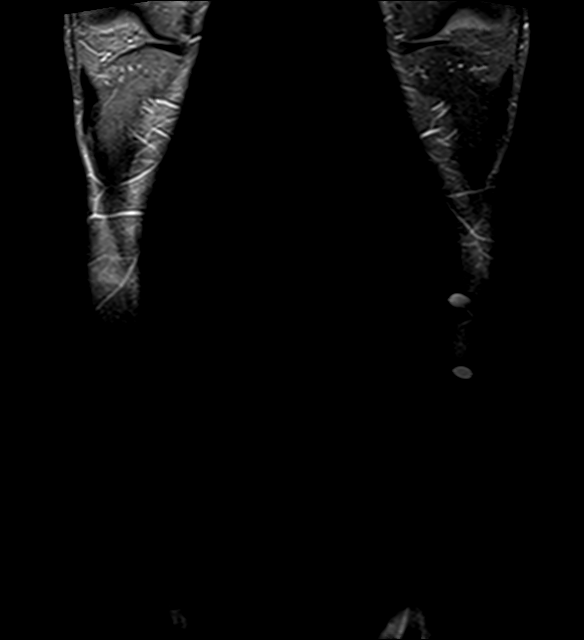

[34 of 40 positions shown; findings below may reference images not displayed]

FINDINGS: The patient's palpable abnormality is marked above and below with
vitamin-E capsules. The palpable abnormality correlates with a
subtle area of nonspecific subcutaneous soft tissue thickening over
the anterior and lateral margin of the tibia. The T2 weighted
sequence and postcontrast sequence demonstrates a slightly prominent
subcutaneous vein in this area but no discrete mass or enhancement.
It is possible this could be a venous varix. I do not see a discrete
soft tissue lesion. The underlying tibia is normal. The underlying
anterior tibialis muscle is normal.
IMPRESSION: 1. Palpable abnormality over the anterior tibia appears to correlate
with a slightly dilated/prominent subcutaneous vein likely a venous
varix. I do not see a discrete soft tissue mass.
2. Normal tibia and calf musculature.
3. Recommend clinical surveillance.

## 2023-09-26 ENCOUNTER — Other Ambulatory Visit: Payer: Self-pay | Admitting: Medical Genetics

## 2023-12-12 ENCOUNTER — Other Ambulatory Visit: Payer: Self-pay | Admitting: Medical Genetics

## 2023-12-12 DIAGNOSIS — Z006 Encounter for examination for normal comparison and control in clinical research program: Secondary | ICD-10-CM

## 2024-03-08 ENCOUNTER — Encounter: Payer: Self-pay | Admitting: Gastroenterology
